# Patient Record
Sex: Male | Born: 1990 | Marital: Single | Smoking: Never smoker
Health system: Southern US, Community
[De-identification: ages and names within clinical notes are randomized; demographics above are authoritative.]

## PROBLEM LIST (undated history)

## (undated) DIAGNOSIS — T4145XA Adverse effect of unspecified anesthetic, initial encounter: Secondary | ICD-10-CM

## (undated) DIAGNOSIS — T8859XA Other complications of anesthesia, initial encounter: Secondary | ICD-10-CM

## (undated) DIAGNOSIS — Z8489 Family history of other specified conditions: Secondary | ICD-10-CM

## (undated) HISTORY — PX: TONSILLECTOMY: SUR1361

---

## 2011-12-09 ENCOUNTER — Emergency Department (HOSPITAL_COMMUNITY)
Admission: EM | Admit: 2011-12-09 | Discharge: 2011-12-09 | Disposition: A | Discharge status: Home / Self Care | Payer: Managed Care, Other (non HMO) | Attending: Emergency Medicine | Admitting: Emergency Medicine

## 2011-12-09 ENCOUNTER — Emergency Department (HOSPITAL_COMMUNITY): Payer: Managed Care, Other (non HMO)

## 2011-12-09 ENCOUNTER — Encounter (HOSPITAL_COMMUNITY): Payer: Self-pay | Admitting: Emergency Medicine

## 2011-12-09 DIAGNOSIS — M542 Cervicalgia: Secondary | ICD-10-CM

## 2011-12-09 DIAGNOSIS — T148XXA Other injury of unspecified body region, initial encounter: Secondary | ICD-10-CM

## 2011-12-09 DIAGNOSIS — S139XXA Sprain of joints and ligaments of unspecified parts of neck, initial encounter: Secondary | ICD-10-CM | POA: Insufficient documentation

## 2011-12-09 DIAGNOSIS — Y998 Other external cause status: Secondary | ICD-10-CM | POA: Insufficient documentation

## 2011-12-09 DIAGNOSIS — Y9366 Activity, soccer: Secondary | ICD-10-CM | POA: Insufficient documentation

## 2011-12-09 DIAGNOSIS — W219XXA Striking against or struck by unspecified sports equipment, initial encounter: Secondary | ICD-10-CM | POA: Insufficient documentation

## 2011-12-09 MED ORDER — IBUPROFEN 800 MG PO TABS: 800.0000 mg | ORAL_TABLET | Freq: Three times a day (TID) | ORAL | Status: AC | PRN

## 2011-12-09 MED ORDER — IBUPROFEN 400 MG PO TABS
800.0000 mg | ORAL_TABLET | Freq: Once | ORAL | Status: AC
  Administered 2011-12-09: 800 mg via ORAL
  Filled 2011-12-09: qty 2
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
## 2011-12-11 NOTE — ED Provider Notes (Signed)
Medical screening examination/treatment/procedure(s) were performed by non-physician practitioner and as supervising physician I was immediately available for consultation/collaboration.   Moxon Messler, MD 12/11/11 1531 

## 2014-06-13 IMAGING — CR DG CERVICAL SPINE COMPLETE 4+V
6 series · 6 of 6 positions shown · non-contrast
Comparison: None.

CLINICAL DATA: Posterior neck pain.  Blunt trauma.

CERVICAL SPINE - COMPLETE 4+ VIEW

[w c-spine lat]
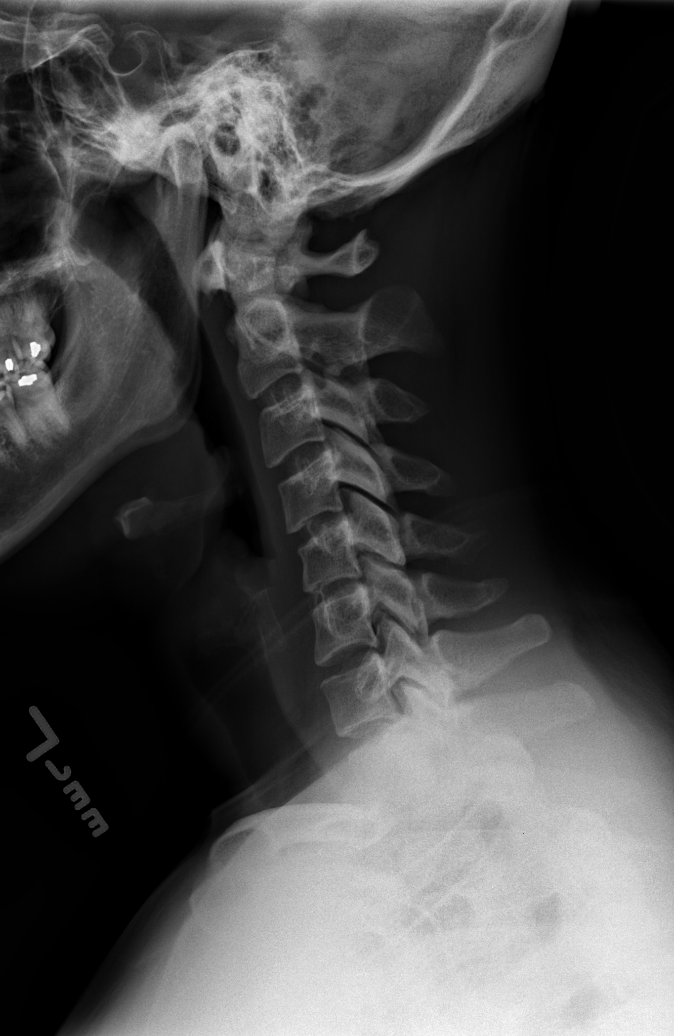

[w c-spine oblique (1 of 2)]
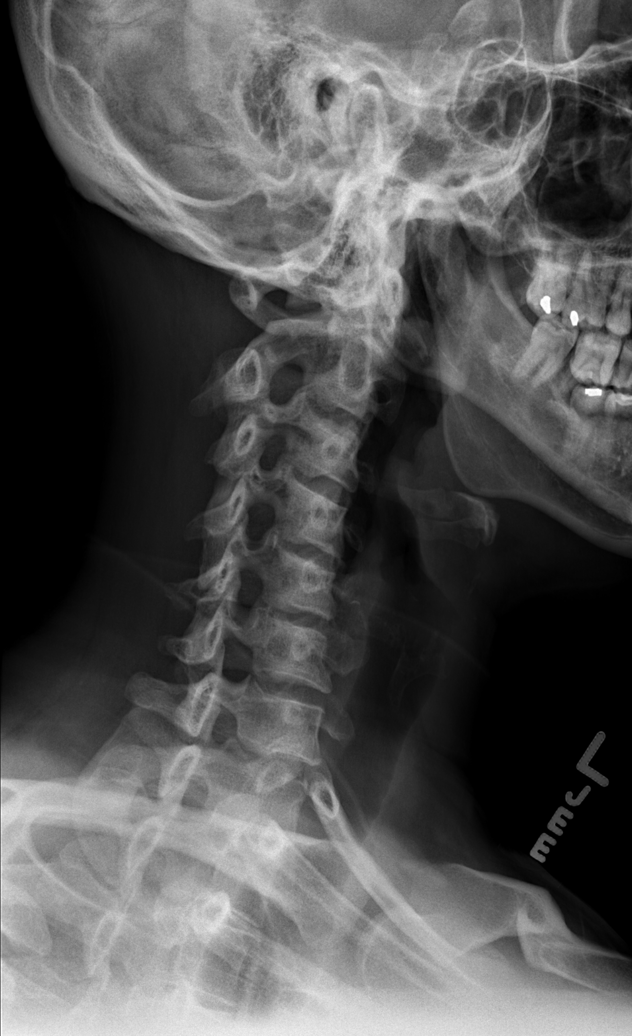

[w c-spine oblique (2 of 2)]
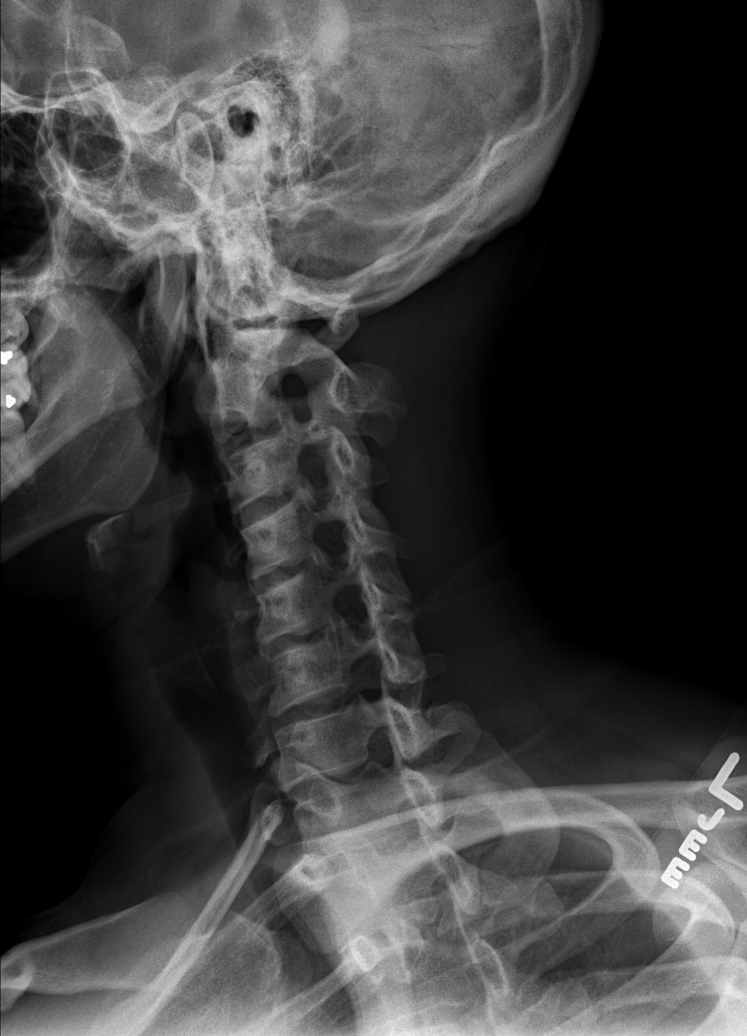

[w c-spine a.p.]
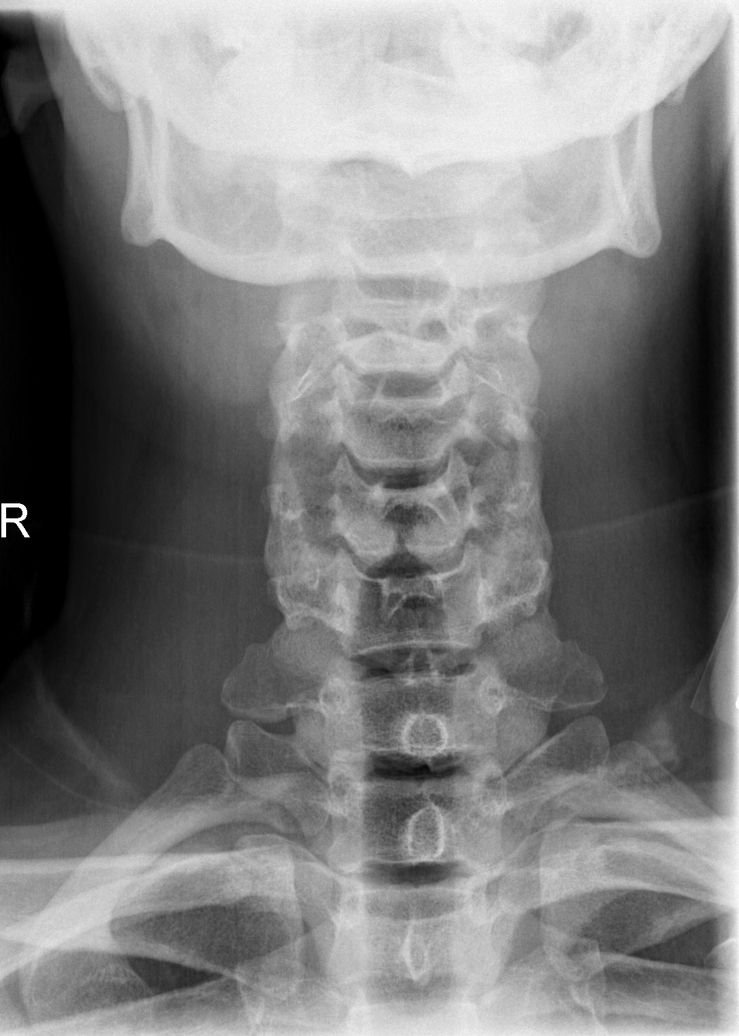

[w c-spine odontoid (1 of 2)]
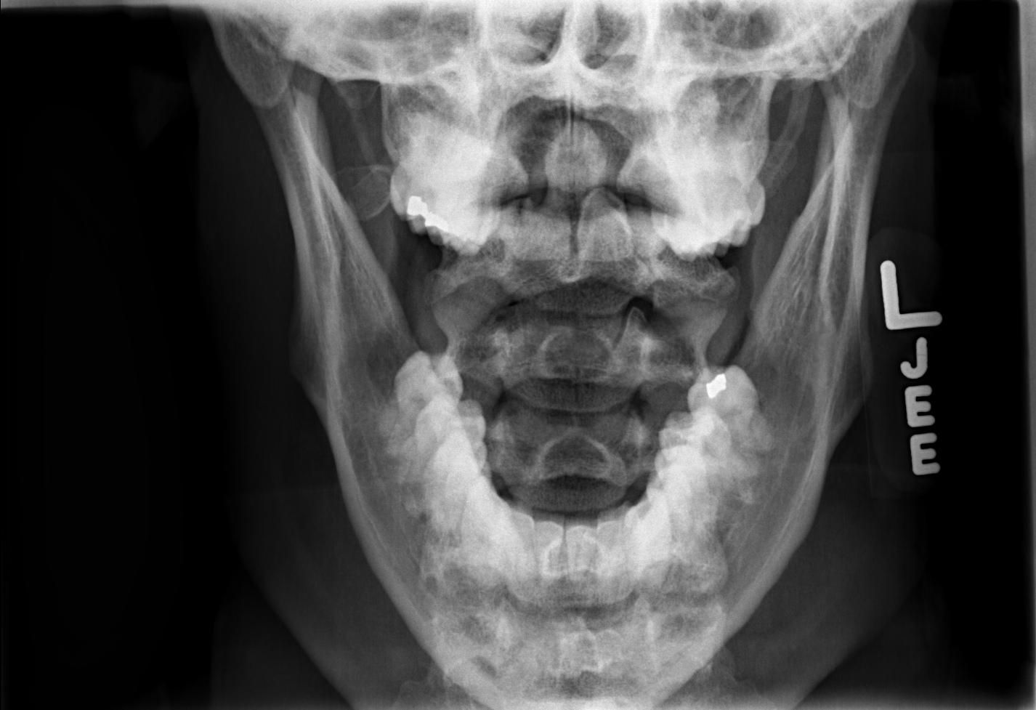

[w c-spine odontoid (2 of 2)]
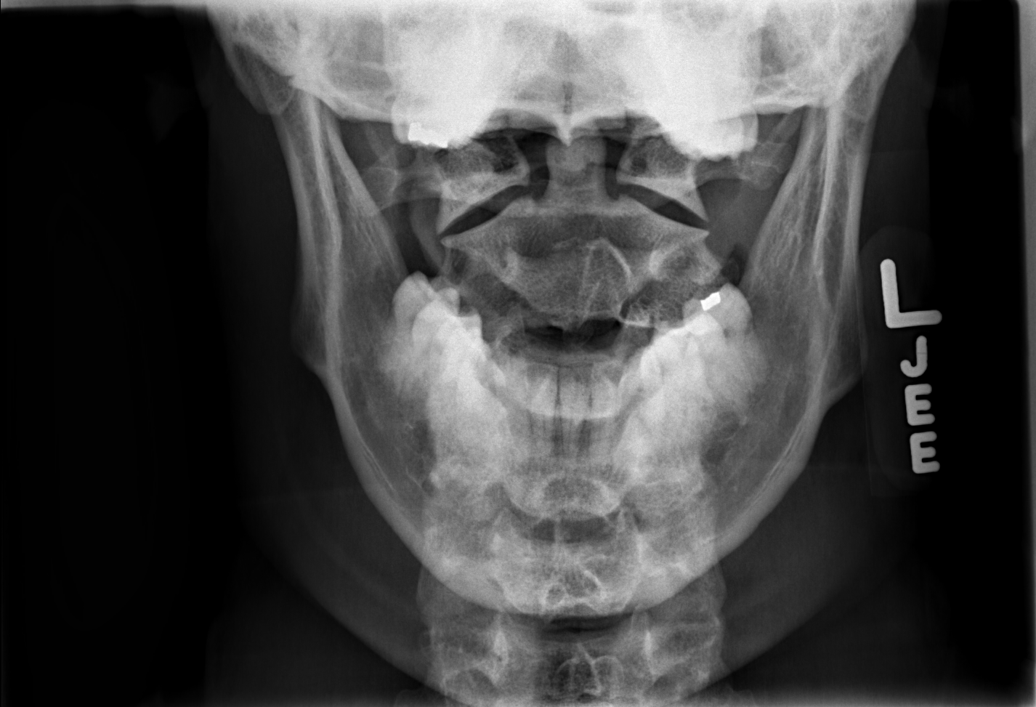

[6 of 6 positions shown; findings below may reference images not displayed]

FINDINGS: The cervical spine is visualized from skull base through
the cervicothoracic junction.  There is straightening of the normal
cervical lordosis.  The prevertebral soft tissues are normal.  The
vertebral body heights and alignment are normal.  The lung apices
are clear.
IMPRESSION: 1.  No acute osseous abnormality.
2.  Straightening of the normal cervical lordosis.  This is
nonspecific, but can be seen in the setting of ongoing pain or
muscle strain.

## 2014-09-25 ENCOUNTER — Observation Stay (HOSPITAL_COMMUNITY): Payer: Managed Care, Other (non HMO) | Admitting: Anesthesiology

## 2014-09-25 ENCOUNTER — Observation Stay (HOSPITAL_COMMUNITY)
Admission: EM | Admit: 2014-09-25 | Discharge: 2014-09-26 | Disposition: A | Discharge status: Home / Self Care | Payer: Managed Care, Other (non HMO) | Attending: Surgery | Admitting: Surgery

## 2014-09-25 ENCOUNTER — Encounter (HOSPITAL_COMMUNITY): Payer: Self-pay | Admitting: Emergency Medicine

## 2014-09-25 ENCOUNTER — Encounter (HOSPITAL_COMMUNITY): Admission: EM | Disposition: A | Discharge status: Home / Self Care | Payer: Self-pay | Source: Home / Self Care

## 2014-09-25 DIAGNOSIS — Z791 Long term (current) use of non-steroidal anti-inflammatories (NSAID): Secondary | ICD-10-CM | POA: Diagnosis not present

## 2014-09-25 DIAGNOSIS — K3580 Unspecified acute appendicitis: Principal | ICD-10-CM | POA: Diagnosis present

## 2014-09-25 DIAGNOSIS — Z7982 Long term (current) use of aspirin: Secondary | ICD-10-CM | POA: Insufficient documentation

## 2014-09-25 HISTORY — DX: Adverse effect of unspecified anesthetic, initial encounter: T41.45XA

## 2014-09-25 HISTORY — DX: Family history of other specified conditions: Z84.89

## 2014-09-25 HISTORY — PX: APPENDECTOMY: SHX54

## 2014-09-25 HISTORY — PX: LAPAROSCOPIC APPENDECTOMY: SHX408

## 2014-09-25 HISTORY — DX: Other complications of anesthesia, initial encounter: T88.59XA

## 2014-09-25 LAB — COMPREHENSIVE METABOLIC PANEL
ALK PHOS: 49 U/L (ref 38–126)
ALT: 18 U/L (ref 17–63)
ANION GAP: 9 (ref 5–15)
AST: 22 U/L (ref 15–41)
Albumin: 4.4 g/dL (ref 3.5–5.0)
BUN: 11 mg/dL (ref 6–20)
CHLORIDE: 100 mmol/L — AB (ref 101–111)
CO2: 29 mmol/L (ref 22–32)
Calcium: 9.4 mg/dL (ref 8.9–10.3)
Creatinine, Ser: 0.97 mg/dL (ref 0.61–1.24)
GFR calc non Af Amer: >60 mL/min (ref >60)
GLUCOSE: 85 mg/dL (ref 65–99)
POTASSIUM: 4.2 mmol/L (ref 3.5–5.1)
SODIUM: 138 mmol/L (ref 135–145)
Total Bilirubin: 0.9 mg/dL (ref 0.3–1.2)
Total Protein: 7.5 g/dL (ref 6.5–8.1)

## 2014-09-25 LAB — CBC WITH DIFFERENTIAL/PLATELET
BASOS PCT: 0 % (ref 0–1)
Basophils Absolute: 0 10*3/uL (ref 0.0–0.1)
Eosinophils Absolute: 0 10*3/uL (ref 0.0–0.7)
Eosinophils Relative: 0 % (ref 0–5)
HEMATOCRIT: 46.7 % (ref 39.0–52.0)
Hemoglobin: 16.5 g/dL (ref 13.0–17.0)
LYMPHS ABS: 1.7 10*3/uL (ref 0.7–4.0)
LYMPHS PCT: 24 % (ref 12–46)
MCH: 32.5 pg (ref 26.0–34.0)
MCHC: 35.3 g/dL (ref 30.0–36.0)
MCV: 91.9 fL (ref 78.0–100.0)
MONOS PCT: 11 % (ref 3–12)
Monocytes Absolute: 0.8 10*3/uL (ref 0.1–1.0)
NEUTROS ABS: 4.6 10*3/uL (ref 1.7–7.7)
Neutrophils Relative %: 65 % (ref 43–77)
PLATELETS: 181 10*3/uL (ref 150–400)
RBC: 5.08 MIL/uL (ref 4.22–5.81)
RDW: 12.7 % (ref 11.5–15.5)
WBC: 7.1 10*3/uL (ref 4.0–10.5)

## 2014-09-25 SURGERY — APPENDECTOMY, LAPAROSCOPIC
Anesthesia: General | Site: Abdomen

## 2014-09-25 MED ORDER — FENTANYL CITRATE (PF) 250 MCG/5ML IJ SOLN
INTRAMUSCULAR | Status: AC
  Filled 2014-09-25: qty 5

## 2014-09-25 MED ORDER — KETOROLAC TROMETHAMINE 30 MG/ML IJ SOLN
INTRAMUSCULAR | Status: AC
  Filled 2014-09-25: qty 1

## 2014-09-25 MED ORDER — CEFAZOLIN SODIUM-DEXTROSE 2-3 GM-% IV SOLR
INTRAVENOUS | Status: AC
  Filled 2014-09-25: qty 50

## 2014-09-25 MED ORDER — DEXTROSE 5 % IV SOLN
1.0000 g | Freq: Four times a day (QID) | INTRAVENOUS | Status: AC
  Administered 2014-09-26: 1 g via INTRAVENOUS
  Filled 2014-09-25: qty 1

## 2014-09-25 MED ORDER — MIDAZOLAM HCL 2 MG/2ML IJ SOLN
INTRAMUSCULAR | Status: AC
  Filled 2014-09-25: qty 4

## 2014-09-25 MED ORDER — SODIUM CHLORIDE 0.9 % IR SOLN
Status: DC | PRN
  Administered 2014-09-25: 1000 mL

## 2014-09-25 MED ORDER — ONDANSETRON HCL 4 MG/2ML IJ SOLN: 4.0000 mg | Freq: Four times a day (QID) | INTRAMUSCULAR | Status: DC | PRN

## 2014-09-25 MED ORDER — PROPOFOL 10 MG/ML IV BOLUS
INTRAVENOUS | Status: AC
  Filled 2014-09-25: qty 20

## 2014-09-25 MED ORDER — 0.9 % SODIUM CHLORIDE (POUR BTL) OPTIME
TOPICAL | Status: DC | PRN
  Administered 2014-09-25: 1000 mL

## 2014-09-25 MED ORDER — MORPHINE SULFATE 2 MG/ML IJ SOLN: 2.0000 mg | INTRAMUSCULAR | Status: DC | PRN

## 2014-09-25 MED ORDER — ONDANSETRON HCL 4 MG PO TABS: 4.0000 mg | ORAL_TABLET | Freq: Four times a day (QID) | ORAL | Status: DC | PRN

## 2014-09-25 MED ORDER — ENOXAPARIN SODIUM 40 MG/0.4ML ~~LOC~~ SOLN
40.0000 mg | SUBCUTANEOUS | Status: DC
  Administered 2014-09-26: 40 mg via SUBCUTANEOUS
  Filled 2014-09-25: qty 0.4

## 2014-09-25 MED ORDER — LIDOCAINE HCL (CARDIAC) 20 MG/ML IV SOLN
INTRAVENOUS | Status: DC | PRN
  Administered 2014-09-25: 100 mg via INTRAVENOUS

## 2014-09-25 MED ORDER — PROPOFOL 10 MG/ML IV BOLUS
INTRAVENOUS | Status: DC | PRN
  Administered 2014-09-25: 200 mg via INTRAVENOUS

## 2014-09-25 MED ORDER — ROCURONIUM BROMIDE 100 MG/10ML IV SOLN
INTRAVENOUS | Status: DC | PRN
  Administered 2014-09-25: 25 mg via INTRAVENOUS

## 2014-09-25 MED ORDER — SUCCINYLCHOLINE CHLORIDE 20 MG/ML IJ SOLN
INTRAMUSCULAR | Status: DC | PRN
  Administered 2014-09-25: 100 mg via INTRAVENOUS

## 2014-09-25 MED ORDER — ONDANSETRON HCL 4 MG/2ML IJ SOLN
INTRAMUSCULAR | Status: DC | PRN
  Administered 2014-09-25: 4 mg via INTRAVENOUS

## 2014-09-25 MED ORDER — CEFAZOLIN SODIUM-DEXTROSE 2-3 GM-% IV SOLR
INTRAVENOUS | Status: DC | PRN
  Administered 2014-09-25: 2 g via INTRAVENOUS

## 2014-09-25 MED ORDER — BUPIVACAINE-EPINEPHRINE 0.25% -1:200000 IJ SOLN
INTRAMUSCULAR | Status: DC | PRN
  Administered 2014-09-25: 5 mL

## 2014-09-25 MED ORDER — KETOROLAC TROMETHAMINE 30 MG/ML IJ SOLN: 30.0000 mg | Freq: Once | INTRAMUSCULAR | Status: DC | PRN

## 2014-09-25 MED ORDER — OXYCODONE HCL 5 MG/5ML PO SOLN: 5.0000 mg | Freq: Once | ORAL | Status: AC | PRN

## 2014-09-25 MED ORDER — PROMETHAZINE HCL 25 MG/ML IJ SOLN: 6.2500 mg | INTRAMUSCULAR | Status: DC | PRN

## 2014-09-25 MED ORDER — GLYCOPYRROLATE 0.2 MG/ML IJ SOLN
INTRAMUSCULAR | Status: DC | PRN
  Administered 2014-09-25: 0.4 mg via INTRAVENOUS

## 2014-09-25 MED ORDER — HYDROMORPHONE HCL 1 MG/ML IJ SOLN
INTRAMUSCULAR | Status: AC
  Filled 2014-09-25: qty 1

## 2014-09-25 MED ORDER — ACETAMINOPHEN 325 MG PO TABS: 650.0000 mg | ORAL_TABLET | ORAL | Status: DC | PRN

## 2014-09-25 MED ORDER — MEPERIDINE HCL 25 MG/ML IJ SOLN: 6.2500 mg | INTRAMUSCULAR | Status: DC | PRN

## 2014-09-25 MED ORDER — HYDROMORPHONE HCL 1 MG/ML IJ SOLN
0.2500 mg | INTRAMUSCULAR | Status: DC | PRN
  Administered 2014-09-25 (×2): 0.5 mg via INTRAVENOUS

## 2014-09-25 MED ORDER — OXYCODONE-ACETAMINOPHEN 5-325 MG PO TABS
1.0000 | ORAL_TABLET | ORAL | Status: DC | PRN
  Administered 2014-09-26: 2 via ORAL
  Filled 2014-09-25: qty 2

## 2014-09-25 MED ORDER — SODIUM CHLORIDE 0.9 % IV SOLN
INTRAVENOUS | Status: DC
  Administered 2014-09-25 (×2): via INTRAVENOUS

## 2014-09-25 MED ORDER — CEFOXITIN SODIUM 2 G IV SOLR
2.0000 g | Freq: Four times a day (QID) | INTRAVENOUS | Status: DC
  Administered 2014-09-26: 2 g via INTRAVENOUS
  Filled 2014-09-25 (×3): qty 2

## 2014-09-25 MED ORDER — ENOXAPARIN SODIUM 40 MG/0.4ML ~~LOC~~ SOLN: 40.0000 mg | SUBCUTANEOUS | Status: DC

## 2014-09-25 MED ORDER — KETOROLAC TROMETHAMINE 30 MG/ML IJ SOLN
30.0000 mg | Freq: Four times a day (QID) | INTRAMUSCULAR | Status: DC
  Administered 2014-09-25 – 2014-09-26 (×2): 30 mg via INTRAVENOUS
  Filled 2014-09-25 (×2): qty 1

## 2014-09-25 MED ORDER — DEXTROSE 5 % IV SOLN: 2.0000 g | Freq: Four times a day (QID) | INTRAVENOUS | Status: DC

## 2014-09-25 MED ORDER — NEOSTIGMINE METHYLSULFATE 10 MG/10ML IV SOLN
INTRAVENOUS | Status: DC | PRN
  Administered 2014-09-25: 3 mg via INTRAVENOUS

## 2014-09-25 MED ORDER — LACTATED RINGERS IV SOLN
INTRAVENOUS | Status: DC | PRN
  Administered 2014-09-25: 21:00:00 via INTRAVENOUS

## 2014-09-25 MED ORDER — OXYCODONE HCL 5 MG PO TABS
5.0000 mg | ORAL_TABLET | Freq: Once | ORAL | Status: AC | PRN
  Administered 2014-09-25: 5 mg via ORAL

## 2014-09-25 MED ORDER — FENTANYL CITRATE (PF) 100 MCG/2ML IJ SOLN
INTRAMUSCULAR | Status: DC | PRN
  Administered 2014-09-25: 100 ug via INTRAVENOUS
  Administered 2014-09-25: 50 ug via INTRAVENOUS

## 2014-09-25 MED ORDER — OXYCODONE HCL 5 MG PO TABS
ORAL_TABLET | ORAL | Status: AC
  Filled 2014-09-25: qty 1

## 2014-09-25 MED ORDER — BUPIVACAINE-EPINEPHRINE (PF) 0.25% -1:200000 IJ SOLN
INTRAMUSCULAR | Status: AC
  Filled 2014-09-25: qty 30

## 2014-09-25 SURGICAL SUPPLY — 40 items
APPLIER CLIP ROT 10 11.4 M/L (STAPLE)
BENZOIN TINCTURE PRP APPL 2/3 (GAUZE/BANDAGES/DRESSINGS) ×3 IMPLANT
BLADE SURG ROTATE 9660 (MISCELLANEOUS) ×3 IMPLANT
CANISTER SUCTION 2500CC (MISCELLANEOUS) ×3 IMPLANT
CHLORAPREP W/TINT 26ML (MISCELLANEOUS) ×3 IMPLANT
CLIP APPLIE ROT 10 11.4 M/L (STAPLE) IMPLANT
CLOSURE WOUND 1/2 X4 (GAUZE/BANDAGES/DRESSINGS) ×1
COVER SURGICAL LIGHT HANDLE (MISCELLANEOUS) ×3 IMPLANT
CUTTER FLEX LINEAR 45M (STAPLE) ×3 IMPLANT
DRSG TEGADERM 2-3/8X2-3/4 SM (GAUZE/BANDAGES/DRESSINGS) ×3 IMPLANT
DRSG TEGADERM 4X4.75 (GAUZE/BANDAGES/DRESSINGS) ×3 IMPLANT
ELECT REM PT RETURN 9FT ADLT (ELECTROSURGICAL) ×3
ELECTRODE REM PT RTRN 9FT ADLT (ELECTROSURGICAL) ×1 IMPLANT
ENDOLOOP SUT PDS II  0 18 (SUTURE)
ENDOLOOP SUT PDS II 0 18 (SUTURE) IMPLANT
FILTER SMOKE EVAC LAPAROSHD (FILTER) ×3 IMPLANT
GAUZE SPONGE 2X2 8PLY STRL LF (GAUZE/BANDAGES/DRESSINGS) ×1 IMPLANT
GLOVE BIO SURGEON STRL SZ7 (GLOVE) ×3 IMPLANT
GLOVE BIOGEL PI IND STRL 7.5 (GLOVE) ×1 IMPLANT
GLOVE BIOGEL PI INDICATOR 7.5 (GLOVE) ×2
GOWN STRL REUS W/ TWL LRG LVL3 (GOWN DISPOSABLE) ×1 IMPLANT
GOWN STRL REUS W/TWL LRG LVL3 (GOWN DISPOSABLE) ×2
KIT BASIN OR (CUSTOM PROCEDURE TRAY) ×3 IMPLANT
KIT ROOM TURNOVER OR (KITS) ×3 IMPLANT
NS IRRIG 1000ML POUR BTL (IV SOLUTION) ×3 IMPLANT
PAD ARMBOARD 7.5X6 YLW CONV (MISCELLANEOUS) ×6 IMPLANT
POUCH SPECIMEN RETRIEVAL 10MM (ENDOMECHANICALS) ×3 IMPLANT
RELOAD STAPLE TA45 3.5 REG BLU (ENDOMECHANICALS) ×3 IMPLANT
SCALPEL HARMONIC ACE (MISCELLANEOUS) ×3 IMPLANT
SET IRRIG TUBING LAPAROSCOPIC (IRRIGATION / IRRIGATOR) ×3 IMPLANT
SPECIMEN JAR SMALL (MISCELLANEOUS) ×3 IMPLANT
SPONGE GAUZE 2X2 STER 10/PKG (GAUZE/BANDAGES/DRESSINGS) ×2
STRIP CLOSURE SKIN 1/2X4 (GAUZE/BANDAGES/DRESSINGS) ×2 IMPLANT
SUT MNCRL AB 4-0 PS2 18 (SUTURE) ×3 IMPLANT
TOWEL OR 17X24 6PK STRL BLUE (TOWEL DISPOSABLE) ×3 IMPLANT
TOWEL OR 17X26 10 PK STRL BLUE (TOWEL DISPOSABLE) ×3 IMPLANT
TRAY LAPAROSCOPIC MC (CUSTOM PROCEDURE TRAY) ×3 IMPLANT
TROCAR XCEL BLADELESS 5X75MML (TROCAR) ×6 IMPLANT
TROCAR XCEL BLUNT TIP 100MML (ENDOMECHANICALS) ×3 IMPLANT
TUBING INSUFFLATION (TUBING) ×3 IMPLANT

## 2014-09-25 NOTE — ED Notes (Signed)
OR is ready is ready to go to periop room 36.

## 2014-09-25 NOTE — Anesthesia Procedure Notes (Signed)
Procedure Name: Intubation Date/Time: 09/25/2014 8:53 PM Performed by: Arlice Colt B Pre-anesthesia Checklist: Patient identified, Emergency Drugs available, Suction available, Patient being monitored and Timeout performed Patient Re-evaluated:Patient Re-evaluated prior to inductionOxygen Delivery Method: Circle system utilized Preoxygenation: Pre-oxygenation with 100% oxygen Intubation Type: IV induction, Rapid sequence and Cricoid Pressure applied Laryngoscope Size: Mac and 3 Grade View: Grade I Tube type: Oral Tube size: 7.5 mm Number of attempts: 1 Placement Confirmation: ETT inserted through vocal cords under direct vision,  positive ETCO2 and breath sounds checked- equal and bilateral Secured at: 23 cm Tube secured with: Tape Dental Injury: Teeth and Oropharynx as per pre-operative assessment

## 2014-09-25 NOTE — ED Notes (Signed)
Pt states that he does not want anything for pain at this time  ?
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
## 2014-09-25 NOTE — Anesthesia Preprocedure Evaluation (Signed)
Anesthesia Evaluation  Patient identified by MRN, date of birth, ID band Patient awake    Reviewed: Allergy & Precautions, NPO status , Patient's Chart, lab work & pertinent test results  Airway Mallampati: II  TM Distance: >3 FB Neck ROM: Full    Dental no notable dental hx.    Pulmonary neg pulmonary ROS,  breath sounds clear to auscultation  Pulmonary exam normal       Cardiovascular negative cardio ROS Normal cardiovascular examRhythm:Regular Rate:Normal     Neuro/Psych negative neurological ROS  negative psych ROS   GI/Hepatic negative GI ROS, Neg liver ROS,   Endo/Other  negative endocrine ROS  Renal/GU negative Renal ROS     Musculoskeletal negative musculoskeletal ROS (+)   Abdominal   Peds  Hematology negative hematology ROS (+)   Anesthesia Other Findings   Reproductive/Obstetrics                             Anesthesia Physical Anesthesia Plan  ASA: I and emergent  Anesthesia Plan: General   Post-op Pain Management:    Induction: Intravenous, Rapid sequence and Cricoid pressure planned  Airway Management Planned: Oral ETT  Additional Equipment: None  Intra-op Plan:   Post-operative Plan: Extubation in OR  Informed Consent: I have reviewed the patients History and Physical, chart, labs and discussed the procedure including the risks, benefits and alternatives for the proposed anesthesia with the patient or authorized representative who has indicated his/her understanding and acceptance.   Dental advisory given  Plan Discussed with: CRNA  Anesthesia Plan Comments:         Anesthesia Quick Evaluation

## 2014-09-25 NOTE — ED Notes (Signed)
Pt transported to the OR. Pt placement notified.

## 2014-09-25 NOTE — Op Note (Signed)
Appendectomy, Lap, Procedure Note  Indications: The patient presented with a three day history of right-sided abdominal pain. A CT scan revealed findings consistent with acute appendicitis.  Pre-operative Diagnosis: Acute appendicitis without mention of peritonitis  Post-operative Diagnosis: Same  Surgeon: Diyari Cherne K.   Assistants: none  Anesthesia: General endotracheal anesthesia  ASA Class: 1E  Procedure Details  The patient was seen again in the Holding Room. The risks, benefits, complications, treatment options, and expected outcomes were discussed with the patient and/or family. The possibilities of reaction to medication, perforation of viscus, bleeding, recurrent infection, finding a normal appendix, the need for additional procedures, failure to diagnose a condition, and creating a complication requiring transfusion or operation were discussed. There was concurrence with the proposed plan and informed consent was obtained. The site of surgery was properly noted. The patient was taken to Operating Room, identified as Dave Lara and the procedure verified as Appendectomy. A Time Out was held and the above information confirmed.  The patient was placed in the supine position and general anesthesia was induced.  The abdomen was prepped and draped in a sterile fashion. A one centimeter infraumbilical incision was made.  Dissection was carried down to the fascia bluntly.  The fascia was incised vertically.  We entered the peritoneal cavity bluntly.  A pursestring suture was passed around the incision with a 0 Vicryl.  The Hasson cannula was introduced into the abdomen and the tails of the suture were used to hold the Hasson in place.   The pneumoperitoneum was then established maintaining a maximum pressure of 15 mmHg.  Additional 5 mm cannulas then placed in the left lower quadrant of the abdomen and the right upper quadrant under direct visualization. A careful evaluation of the  entire abdomen was carried out. The patient was placed in Trendelenburg and left lateral decubitus position.  The scope was moved to the right upper quadrant port site. The cecum was mobilized medially by lysing some adhesions with the harmonic scalpel.  The appendix was identified.  It was quite thickened and inflamed, but there was no sign of perforation. The appendix was carefully dissected. The appendix was was skeletonized with the harmonic scalpel.   The appendix was divided at its base using an endo-GIA stapler. Minimal appendiceal stump was left in place. There was no evidence of bleeding, leakage, or complication after division of the appendix. Irrigation was also performed and irrigate suctioned from the abdomen as well.  The umbilical port site was closed with the purse string suture. There was no residual palpable fascial defect.  The trocar site skin wounds were closed with 4-0 Monocryl.  Instrument, sponge, and needle counts were correct at the conclusion of the case.   Findings: The appendix was found to be inflamed. There were not signs of necrosis.  There was not perforation. There was not abscess formation.  Estimated Blood Loss:  Minimal         Drains: none         Specimens: appendix         Complications:  None; patient tolerated the procedure well.         Disposition: PACU - hemodynamically stable.         Condition: stable  Dave Lara. Dave Skains, MD, [facility] Surgery  General/ Trauma Surgery  09/25/2014 9:46 PM

## 2014-09-25 NOTE — ED Notes (Signed)
This RN received a call from PACU, asking if she had the carbon copy of the list of items taken to the safe with security. This RN declined, but spoke with the person who filled out the form, who states that she tubed the form to the OR. This RN spoke to security who states that they do indeed have the pt's valuables in the safe, and will let this RN sign out the package and deliver them to the pt when he arrives in his room on 5N. This RN spoke with Christen Bame, RN on 5N to tell her the plan, and with the PACU to also tell them the plan.

## 2014-09-25 NOTE — Anesthesia Postprocedure Evaluation (Signed)
Anesthesia Post Note  Patient: Dave Lara  Procedure(s) Performed: Procedure(s) (LRB): APPENDECTOMY LAPAROSCOPIC (N/A)  Anesthesia type: General  Patient location: PACU  Post pain: Pain level controlled  Post assessment: Post-op Vital signs reviewed  Last Vitals: BP 134/68 mmHg  Pulse 67  Temp(Src) 36.8 C (Oral)  Resp 22  Wt 187 lb 4.8 oz (84.959 kg)  SpO2 96%  Post vital signs: Reviewed  Level of consciousness: sedated  Complications: No apparent anesthesia complications

## 2014-09-25 NOTE — ED Notes (Signed)
Pt sts RLQ pain and had CT scan done today and showed appy and here for eval

## 2014-09-25 NOTE — Transfer of Care (Signed)
Immediate Anesthesia Transfer of Care Note  Patient: Dave Lara  Procedure(s) Performed: Procedure(s): APPENDECTOMY LAPAROSCOPIC (N/A)  Patient Location: PACU  Anesthesia Type:General  Level of Consciousness: awake, alert  and oriented  Airway & Oxygen Therapy: Patient Spontanous Breathing  Post-op Assessment: Report given to RN and Post -op Vital signs reviewed and stable  Post vital signs: Reviewed and stable  Last Vitals:  Filed Vitals:   09/25/14 2015  BP:   Pulse: 41  Temp:   Resp:     Complications: No apparent anesthesia complications

## 2014-09-25 NOTE — ED Notes (Signed)
Per Zella Ball in patient placement, there are no beds available on 6N. This RN spoke with Dr. Corliss Skains who states that it is ok for the pt to go to 5N, but not to move him, as the pt is his next case to go to the OR.

## 2014-09-26 ENCOUNTER — Encounter (HOSPITAL_COMMUNITY): Payer: Self-pay | Admitting: General Practice

## 2014-09-26 MED ORDER — OXYCODONE-ACETAMINOPHEN 5-325 MG PO TABS: 1.0000 | ORAL_TABLET | ORAL | Status: AC | PRN

## 2014-09-26 NOTE — Discharge Summary (Addendum)
Patient ID: Dave Lara MRN: [id number] DOB/AGE: [birth year] 23 y.o.  Admit date: 09/25/2014 Discharge date: 09/26/2014  Procedures: lap appy  Consults: None  Reason for Admission: This is a 24 yo male in good health who presents with a three day history of worsening lower abdominal pain. No nausea/ vomiting. Still hungry. He went to see Dr. Link Snuffer this morning, who referred him for a CT scan. This showed acute appendicitis. He is referred for surgical management  Admission Diagnoses:  1. Acute appendicitis  Hospital Course: The patient was admitted and taken for a lap appy.  He tolerated this well.  On POD 1, he is tolerating a regular diet and oral pain meds.  He is stable for dc home.  PE: Abd: soft, appropriately tender, slight ooze at umbilical incision, but resolved.  New gauze and tape replaced. +BS, ND  Discharge Diagnoses:  Active Problems:   Acute appendicitis s/p lap appy  Discharge Medications:   Medication List    TAKE these medications        ibuprofen 800 MG tablet  Commonly known as:  ADVIL,MOTRIN  Take 1 tablet (800 mg total) by mouth every 8 (eight) hours as needed for pain.     oxyCODONE-acetaminophen 5-325 MG per tablet  Commonly known as:  PERCOCET/ROXICET  Take 1-2 tablets by mouth every 4 (four) hours as needed for moderate pain.        Discharge Instructions:     Follow-up Information    Follow up with [facility]. Go on 10/16/2014.   Specialty:  General Surgery   Why:  For post-operation check. Your appointment is at 3:45pm, please arrive at least 30 min before your appointment to complete your check in paperwork.  If you are unable to arrive 30 min prior to your appointment time we may have to cancel or reschedule you   Contact information:   [address] [place] [address] [phone]       Signed: Letha Cape 09/26/2014, 10:20 AM

## 2014-09-26 NOTE — Discharge Instructions (Signed)
